# Patient Record
Sex: Female | Born: 1935 | Race: White | Hispanic: No | Marital: Married
Health system: Southern US, Community
[De-identification: ages and names within clinical notes are randomized; demographics above are authoritative.]

---

## 2004-11-19 ENCOUNTER — Ambulatory Visit: Payer: Self-pay | Admitting: Internal Medicine

## 2005-02-07 ENCOUNTER — Other Ambulatory Visit: Payer: Self-pay

## 2005-02-08 ENCOUNTER — Inpatient Hospital Stay: Payer: Self-pay | Admitting: Internal Medicine

## 2006-01-06 ENCOUNTER — Other Ambulatory Visit: Payer: Self-pay

## 2006-01-06 ENCOUNTER — Inpatient Hospital Stay: Payer: Self-pay | Admitting: Internal Medicine

## 2006-05-27 ENCOUNTER — Other Ambulatory Visit: Payer: Self-pay

## 2006-05-27 ENCOUNTER — Emergency Department: Payer: Self-pay | Admitting: Emergency Medicine

## 2006-06-01 ENCOUNTER — Other Ambulatory Visit: Payer: Self-pay

## 2006-06-01 ENCOUNTER — Inpatient Hospital Stay: Payer: Self-pay | Admitting: Internal Medicine

## 2007-10-02 ENCOUNTER — Inpatient Hospital Stay: Payer: Self-pay | Admitting: Internal Medicine

## 2008-05-21 ENCOUNTER — Other Ambulatory Visit: Payer: Self-pay

## 2008-05-21 ENCOUNTER — Inpatient Hospital Stay: Payer: Self-pay | Admitting: Internal Medicine

## 2009-07-25 ENCOUNTER — Inpatient Hospital Stay: Payer: Self-pay | Admitting: Internal Medicine

## 2010-08-15 ENCOUNTER — Emergency Department: Payer: Self-pay | Admitting: Emergency Medicine

## 2010-12-03 ENCOUNTER — Ambulatory Visit: Payer: Self-pay | Admitting: Internal Medicine

## 2011-11-10 ENCOUNTER — Ambulatory Visit: Payer: Self-pay | Admitting: Internal Medicine

## 2011-11-18 ENCOUNTER — Ambulatory Visit: Payer: Self-pay | Admitting: Physical Medicine and Rehabilitation

## 2011-11-18 ENCOUNTER — Inpatient Hospital Stay: Payer: Self-pay | Admitting: Internal Medicine

## 2011-11-18 LAB — URINALYSIS, COMPLETE
Bilirubin,UR: NEGATIVE
Glucose,UR: NEGATIVE mg/dL (ref 0–75)
Ketone: NEGATIVE
Nitrite: NEGATIVE
Ph: 5 (ref 4.5–8.0)
Protein: 100
RBC,UR: 119 /HPF (ref 0–5)
Squamous Epithelial: 8
WBC UR: 271 /HPF (ref 0–5)

## 2011-11-18 LAB — COMPREHENSIVE METABOLIC PANEL
Alkaline Phosphatase: 84 U/L (ref 50–136)
Anion Gap: 13 (ref 7–16)
Bilirubin,Total: 0.5 mg/dL (ref 0.2–1.0)
Calcium, Total: 9 mg/dL (ref 8.5–10.1)
Co2: 25 mmol/L (ref 21–32)
EGFR (Non-African Amer.): 11 — ABNORMAL LOW
Glucose: 118 mg/dL — ABNORMAL HIGH (ref 65–99)
Osmolality: 305 (ref 275–301)
Potassium: 4.3 mmol/L (ref 3.5–5.1)
SGOT(AST): 14 U/L — ABNORMAL LOW (ref 15–37)
SGPT (ALT): 13 U/L
Sodium: 140 mmol/L (ref 136–145)
Total Protein: 7.4 g/dL (ref 6.4–8.2)

## 2011-11-18 LAB — CBC
HCT: 32.7 % — ABNORMAL LOW (ref 35.0–47.0)
HGB: 11.1 g/dL — ABNORMAL LOW (ref 12.0–16.0)
MCH: 29.7 pg (ref 26.0–34.0)
MCV: 88 fL (ref 80–100)
Platelet: 405 10*3/uL (ref 150–440)
RBC: 3.72 10*6/uL — ABNORMAL LOW (ref 3.80–5.20)
WBC: 17.9 10*3/uL — ABNORMAL HIGH (ref 3.6–11.0)

## 2011-11-18 LAB — TROPONIN I: Troponin-I: <0.02 ng/mL

## 2011-11-18 LAB — CK TOTAL AND CKMB (NOT AT ARMC)
CK, Total: 32 U/L (ref 21–215)
CK-MB: 1.1 ng/mL (ref 0.5–3.6)

## 2011-11-19 LAB — COMPREHENSIVE METABOLIC PANEL
Alkaline Phosphatase: 80 U/L (ref 50–136)
Anion Gap: 16 (ref 7–16)
BUN: 78 mg/dL — ABNORMAL HIGH (ref 7–18)
Bilirubin,Total: 0.4 mg/dL (ref 0.2–1.0)
Calcium, Total: 8.6 mg/dL (ref 8.5–10.1)
Chloride: 103 mmol/L (ref 98–107)
Co2: 21 mmol/L (ref 21–32)
Creatinine: 3.87 mg/dL — ABNORMAL HIGH (ref 0.60–1.30)
EGFR (African American): 14 — ABNORMAL LOW
EGFR (Non-African Amer.): 12 — ABNORMAL LOW
SGOT(AST): 14 U/L — ABNORMAL LOW (ref 15–37)
SGPT (ALT): 12 U/L
Sodium: 140 mmol/L (ref 136–145)

## 2011-11-19 LAB — CBC WITH DIFFERENTIAL/PLATELET
Basophil #: 0 10*3/uL (ref 0.0–0.1)
Eosinophil %: 0 %
Lymphocyte #: 0.6 10*3/uL — ABNORMAL LOW (ref 1.0–3.6)
Lymphocyte %: 5.3 %
MCH: 29.1 pg (ref 26.0–34.0)
MCV: 88 fL (ref 80–100)
Monocyte %: 0.1 %
Neutrophil #: 10.5 10*3/uL — ABNORMAL HIGH (ref 1.4–6.5)
RBC: 3.44 10*6/uL — ABNORMAL LOW (ref 3.80–5.20)
RDW: 13.1 % (ref 11.5–14.5)

## 2011-11-20 LAB — CBC WITH DIFFERENTIAL/PLATELET
Basophil #: 0.1 10*3/uL (ref 0.0–0.1)
Basophil %: 0.4 %
Eosinophil #: 0 10*3/uL (ref 0.0–0.7)
HGB: 8.7 g/dL — ABNORMAL LOW (ref 12.0–16.0)
Lymphocyte %: 6.4 %
MCHC: 33.1 g/dL (ref 32.0–36.0)
Monocyte %: 4.8 %
Neutrophil %: 88.4 %
RBC: 2.97 10*6/uL — ABNORMAL LOW (ref 3.80–5.20)
RDW: 13.3 % (ref 11.5–14.5)
WBC: 18.7 10*3/uL — ABNORMAL HIGH (ref 3.6–11.0)

## 2011-11-20 LAB — COMPREHENSIVE METABOLIC PANEL
Albumin: 1.8 g/dL — ABNORMAL LOW (ref 3.4–5.0)
Alkaline Phosphatase: 61 U/L (ref 50–136)
BUN: 82 mg/dL — ABNORMAL HIGH (ref 7–18)
Bilirubin,Total: 0.2 mg/dL (ref 0.2–1.0)
Chloride: 106 mmol/L (ref 98–107)
Co2: 23 mmol/L (ref 21–32)
EGFR (African American): 16 — ABNORMAL LOW
EGFR (Non-African Amer.): 13 — ABNORMAL LOW
Glucose: 106 mg/dL — ABNORMAL HIGH (ref 65–99)
SGOT(AST): 11 U/L — ABNORMAL LOW (ref 15–37)
SGPT (ALT): 8 U/L — ABNORMAL LOW

## 2011-11-21 LAB — CBC WITH DIFFERENTIAL/PLATELET
Basophil #: 0 10*3/uL (ref 0.0–0.1)
Basophil %: 0 %
HCT: 30 % — ABNORMAL LOW (ref 35.0–47.0)
HGB: 9.9 g/dL — ABNORMAL LOW (ref 12.0–16.0)
Lymphocyte #: 0.7 10*3/uL — ABNORMAL LOW (ref 1.0–3.6)
Lymphocyte %: 3.7 %
MCH: 29 pg (ref 26.0–34.0)
MCHC: 32.9 g/dL (ref 32.0–36.0)
Neutrophil #: 16.6 10*3/uL — ABNORMAL HIGH (ref 1.4–6.5)
Platelet: 401 10*3/uL (ref 150–440)
RDW: 13.2 % (ref 11.5–14.5)

## 2011-11-21 LAB — BASIC METABOLIC PANEL
BUN: 74 mg/dL — ABNORMAL HIGH (ref 7–18)
Chloride: 109 mmol/L — ABNORMAL HIGH (ref 98–107)
Creatinine: 3.17 mg/dL — ABNORMAL HIGH (ref 0.60–1.30)
EGFR (African American): 18 — ABNORMAL LOW
EGFR (Non-African Amer.): 15 — ABNORMAL LOW
Glucose: 108 mg/dL — ABNORMAL HIGH (ref 65–99)
Osmolality: 307 (ref 275–301)
Potassium: 4.1 mmol/L (ref 3.5–5.1)
Sodium: 143 mmol/L (ref 136–145)

## 2011-11-22 LAB — CBC WITH DIFFERENTIAL/PLATELET
Basophil #: 0 10*3/uL (ref 0.0–0.1)
Basophil %: 0.1 %
Eosinophil #: 0 10*3/uL (ref 0.0–0.7)
Lymphocyte #: 0.6 10*3/uL — ABNORMAL LOW (ref 1.0–3.6)
Lymphocyte %: 3.8 %
MCH: 29.3 pg (ref 26.0–34.0)
MCHC: 33.2 g/dL (ref 32.0–36.0)
Monocyte #: 0.4 10*3/uL (ref 0.0–0.7)
Monocyte %: 2.6 %
Neutrophil #: 14.7 10*3/uL — ABNORMAL HIGH (ref 1.4–6.5)
Platelet: 355 10*3/uL (ref 150–440)
RDW: 13.3 % (ref 11.5–14.5)

## 2011-11-22 LAB — BASIC METABOLIC PANEL
Calcium, Total: 8.3 mg/dL — ABNORMAL LOW (ref 8.5–10.1)
Co2: 27 mmol/L (ref 21–32)
Creatinine: 2.82 mg/dL — ABNORMAL HIGH (ref 0.60–1.30)
EGFR (Non-African Amer.): 17 — ABNORMAL LOW
Glucose: 137 mg/dL — ABNORMAL HIGH (ref 65–99)
Osmolality: 307 (ref 275–301)
Potassium: 4 mmol/L (ref 3.5–5.1)
Sodium: 143 mmol/L (ref 136–145)

## 2011-11-22 LAB — SEDIMENTATION RATE: Erythrocyte Sed Rate: 62 mm/hr — ABNORMAL HIGH (ref 0–30)

## 2011-11-23 LAB — CBC WITH DIFFERENTIAL/PLATELET
Basophil #: 0 10*3/uL (ref 0.0–0.1)
Basophil %: 0.2 %
Eosinophil %: 3.6 %
HGB: 8.7 g/dL — ABNORMAL LOW (ref 12.0–16.0)
Lymphocyte #: 1.8 10*3/uL (ref 1.0–3.6)
MCV: 88 fL (ref 80–100)
Monocyte %: 5 %
Neutrophil %: 79.6 %
Platelet: 341 10*3/uL (ref 150–440)
RDW: 13.5 % (ref 11.5–14.5)
WBC: 15.4 10*3/uL — ABNORMAL HIGH (ref 3.6–11.0)

## 2011-11-23 LAB — CULTURE, BLOOD (SINGLE)

## 2011-11-23 LAB — BASIC METABOLIC PANEL
BUN: 59 mg/dL — ABNORMAL HIGH (ref 7–18)
Chloride: 110 mmol/L — ABNORMAL HIGH (ref 98–107)
Co2: 26 mmol/L (ref 21–32)
Creatinine: 2.57 mg/dL — ABNORMAL HIGH (ref 0.60–1.30)
Osmolality: 309 (ref 275–301)
Potassium: 4.3 mmol/L (ref 3.5–5.1)
Sodium: 147 mmol/L — ABNORMAL HIGH (ref 136–145)

## 2011-12-15 ENCOUNTER — Inpatient Hospital Stay: Payer: Self-pay | Admitting: Internal Medicine

## 2011-12-15 LAB — CBC WITH DIFFERENTIAL/PLATELET
Basophil %: 0.1 %
Eosinophil #: 0.4 10*3/uL (ref 0.0–0.7)
Eosinophil %: 3.6 %
HCT: 29.1 % — ABNORMAL LOW (ref 35.0–47.0)
HGB: 9.5 g/dL — ABNORMAL LOW (ref 12.0–16.0)
Lymphocyte %: 13 %
MCHC: 32.7 g/dL (ref 32.0–36.0)
Monocyte %: 4.4 %
Neutrophil #: 8.3 10*3/uL — ABNORMAL HIGH (ref 1.4–6.5)
Neutrophil %: 78.9 %
RBC: 3.37 10*6/uL — ABNORMAL LOW (ref 3.80–5.20)

## 2011-12-15 LAB — COMPREHENSIVE METABOLIC PANEL
Albumin: 2.1 g/dL — ABNORMAL LOW (ref 3.4–5.0)
Alkaline Phosphatase: 95 U/L (ref 50–136)
Anion Gap: 15 (ref 7–16)
BUN: 79 mg/dL — ABNORMAL HIGH (ref 7–18)
Bilirubin,Total: 0.4 mg/dL (ref 0.2–1.0)
Calcium, Total: 7.7 mg/dL — ABNORMAL LOW (ref 8.5–10.1)
Creatinine: 6.63 mg/dL — ABNORMAL HIGH (ref 0.60–1.30)
EGFR (Non-African Amer.): 6 — ABNORMAL LOW
Glucose: 80 mg/dL (ref 65–99)
Osmolality: 304 (ref 275–301)
Potassium: 5.2 mmol/L — ABNORMAL HIGH (ref 3.5–5.1)
Sodium: 141 mmol/L (ref 136–145)
Total Protein: 6.6 g/dL (ref 6.4–8.2)

## 2011-12-15 LAB — TSH: Thyroid Stimulating Horm: 2.94 u[IU]/mL

## 2011-12-16 LAB — BASIC METABOLIC PANEL
Calcium, Total: 7.3 mg/dL — ABNORMAL LOW (ref 8.5–10.1)
Chloride: 112 mmol/L — ABNORMAL HIGH (ref 98–107)
Co2: 16 mmol/L — ABNORMAL LOW (ref 21–32)
Creatinine: 6.02 mg/dL — ABNORMAL HIGH (ref 0.60–1.30)
EGFR (African American): 9 — ABNORMAL LOW
Sodium: 144 mmol/L (ref 136–145)

## 2011-12-17 LAB — URINALYSIS, COMPLETE
Bilirubin,UR: NEGATIVE
Cellular Cast: 3
Hyaline Cast: 1
Ketone: NEGATIVE
Nitrite: NEGATIVE
Ph: 5 (ref 4.5–8.0)
Protein: 100
RBC,UR: 63 /HPF (ref 0–5)
Specific Gravity: 1.013 (ref 1.003–1.030)
Squamous Epithelial: 1

## 2011-12-17 LAB — BASIC METABOLIC PANEL
Calcium, Total: 7.4 mg/dL — ABNORMAL LOW (ref 8.5–10.1)
Chloride: 115 mmol/L — ABNORMAL HIGH (ref 98–107)
Co2: 17 mmol/L — ABNORMAL LOW (ref 21–32)
Creatinine: 5.63 mg/dL — ABNORMAL HIGH (ref 0.60–1.30)
Glucose: 98 mg/dL (ref 65–99)

## 2011-12-18 LAB — BASIC METABOLIC PANEL
Calcium, Total: 7.3 mg/dL — ABNORMAL LOW (ref 8.5–10.1)
Chloride: 115 mmol/L — ABNORMAL HIGH (ref 98–107)
Co2: 17 mmol/L — ABNORMAL LOW (ref 21–32)
Creatinine: 5.12 mg/dL — ABNORMAL HIGH (ref 0.60–1.30)
EGFR (African American): 10 — ABNORMAL LOW
Glucose: 82 mg/dL (ref 65–99)
Potassium: 4.6 mmol/L (ref 3.5–5.1)
Sodium: 143 mmol/L (ref 136–145)

## 2011-12-19 LAB — BASIC METABOLIC PANEL
Calcium, Total: 7.1 mg/dL — ABNORMAL LOW (ref 8.5–10.1)
Co2: 15 mmol/L — ABNORMAL LOW (ref 21–32)
EGFR (African American): 11 — ABNORMAL LOW
EGFR (Non-African Amer.): 9 — ABNORMAL LOW
Glucose: 73 mg/dL (ref 65–99)
Potassium: 4.3 mmol/L (ref 3.5–5.1)
Sodium: 145 mmol/L (ref 136–145)

## 2011-12-20 LAB — CBC WITH DIFFERENTIAL/PLATELET
Basophil #: 0 10*3/uL (ref 0.0–0.1)
Basophil %: 0.1 %
Eosinophil #: 0.4 10*3/uL (ref 0.0–0.7)
HCT: 23.5 % — ABNORMAL LOW (ref 35.0–47.0)
Lymphocyte #: 1 10*3/uL (ref 1.0–3.6)
MCH: 28 pg (ref 26.0–34.0)
MCHC: 33 g/dL (ref 32.0–36.0)
MCV: 85 fL (ref 80–100)
Monocyte #: 0.6 10*3/uL (ref 0.0–0.7)
Monocyte %: 3.9 %
Platelet: 276 10*3/uL (ref 150–440)
RBC: 2.77 10*6/uL — ABNORMAL LOW (ref 3.80–5.20)
RDW: 14.2 % (ref 11.5–14.5)
WBC: 14.1 10*3/uL — ABNORMAL HIGH (ref 3.6–11.0)

## 2011-12-20 LAB — BASIC METABOLIC PANEL
Anion Gap: 15 (ref 7–16)
BUN: 61 mg/dL — ABNORMAL HIGH (ref 7–18)
Calcium, Total: 6.8 mg/dL — CL (ref 8.5–10.1)
Creatinine: 4.27 mg/dL — ABNORMAL HIGH (ref 0.60–1.30)
EGFR (African American): 13 — ABNORMAL LOW
EGFR (Non-African Amer.): 10 — ABNORMAL LOW
Glucose: 76 mg/dL (ref 65–99)
Potassium: 4.1 mmol/L (ref 3.5–5.1)
Sodium: 141 mmol/L (ref 136–145)

## 2011-12-21 LAB — BASIC METABOLIC PANEL
Calcium, Total: 7.4 mg/dL — ABNORMAL LOW (ref 8.5–10.1)
Co2: 12 mmol/L — ABNORMAL LOW (ref 21–32)
EGFR (African American): 12 — ABNORMAL LOW
Glucose: 59 mg/dL — ABNORMAL LOW (ref 65–99)
Osmolality: 300 (ref 275–301)
Potassium: 4.1 mmol/L (ref 3.5–5.1)
Sodium: 143 mmol/L (ref 136–145)

## 2011-12-22 LAB — CBC WITH DIFFERENTIAL/PLATELET
Basophil #: 0 10*3/uL (ref 0.0–0.1)
Basophil %: 0.4 %
Eosinophil #: 0.5 10*3/uL (ref 0.0–0.7)
Eosinophil %: 3.3 %
HCT: 20.3 % — ABNORMAL LOW (ref 35.0–47.0)
HGB: 6.7 g/dL — ABNORMAL LOW (ref 12.0–16.0)
Lymphocyte #: 1.3 10*3/uL (ref 1.0–3.6)
MCH: 27.7 pg (ref 26.0–34.0)
MCHC: 32.9 g/dL (ref 32.0–36.0)
MCV: 84 fL (ref 80–100)
Monocyte #: 0.8 10*3/uL — ABNORMAL HIGH (ref 0.0–0.7)
Neutrophil %: 80.9 %
Platelet: 295 10*3/uL (ref 150–440)
WBC: 13.8 10*3/uL — ABNORMAL HIGH (ref 3.6–11.0)

## 2011-12-22 LAB — BASIC METABOLIC PANEL
Calcium, Total: 7.3 mg/dL — ABNORMAL LOW (ref 8.5–10.1)
Creatinine: 4.65 mg/dL — ABNORMAL HIGH (ref 0.60–1.30)
EGFR (Non-African Amer.): 9 — ABNORMAL LOW
Glucose: 78 mg/dL (ref 65–99)
Potassium: 3.9 mmol/L (ref 3.5–5.1)
Sodium: 146 mmol/L — ABNORMAL HIGH (ref 136–145)

## 2011-12-23 LAB — BASIC METABOLIC PANEL
BUN: 56 mg/dL — ABNORMAL HIGH (ref 7–18)
Chloride: 117 mmol/L — ABNORMAL HIGH (ref 98–107)
Co2: 18 mmol/L — ABNORMAL LOW (ref 21–32)
Creatinine: 4.56 mg/dL — ABNORMAL HIGH (ref 0.60–1.30)
EGFR (African American): 12 — ABNORMAL LOW
Glucose: 84 mg/dL (ref 65–99)
Osmolality: 307 (ref 275–301)
Potassium: 3.8 mmol/L (ref 3.5–5.1)
Sodium: 147 mmol/L — ABNORMAL HIGH (ref 136–145)

## 2011-12-23 LAB — HEMOGLOBIN: HGB: 7.8 g/dL — ABNORMAL LOW (ref 12.0–16.0)

## 2012-01-08 ENCOUNTER — Emergency Department: Payer: Self-pay | Admitting: Emergency Medicine

## 2012-02-01 DEATH — deceased

## 2013-09-16 IMAGING — CR DG CHEST 2V
1 series · 2 of 2 positions shown · non-contrast
Comparison: none

REASON FOR EXAM: dyspnea
COMMENTS:

[Series 3: x chest ap · 0.14mm/px · 2 of 2 slices shown]
[im 1/2]
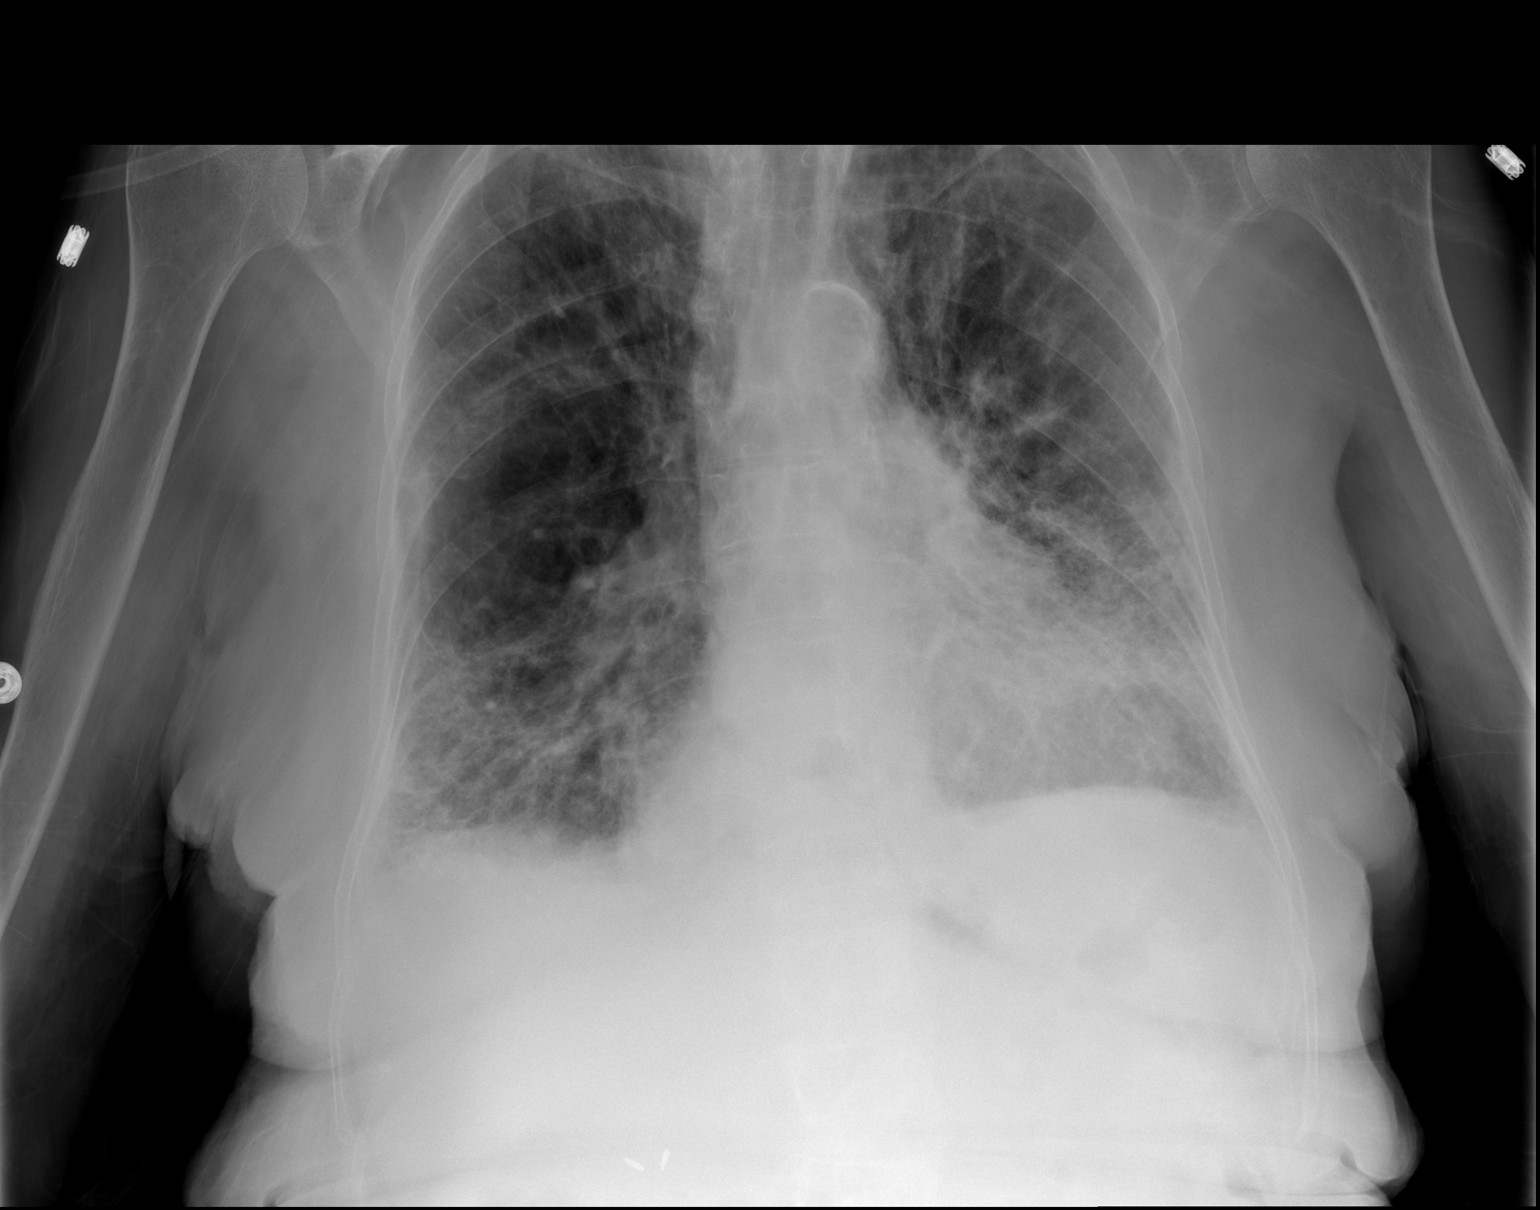
[im 2/2]
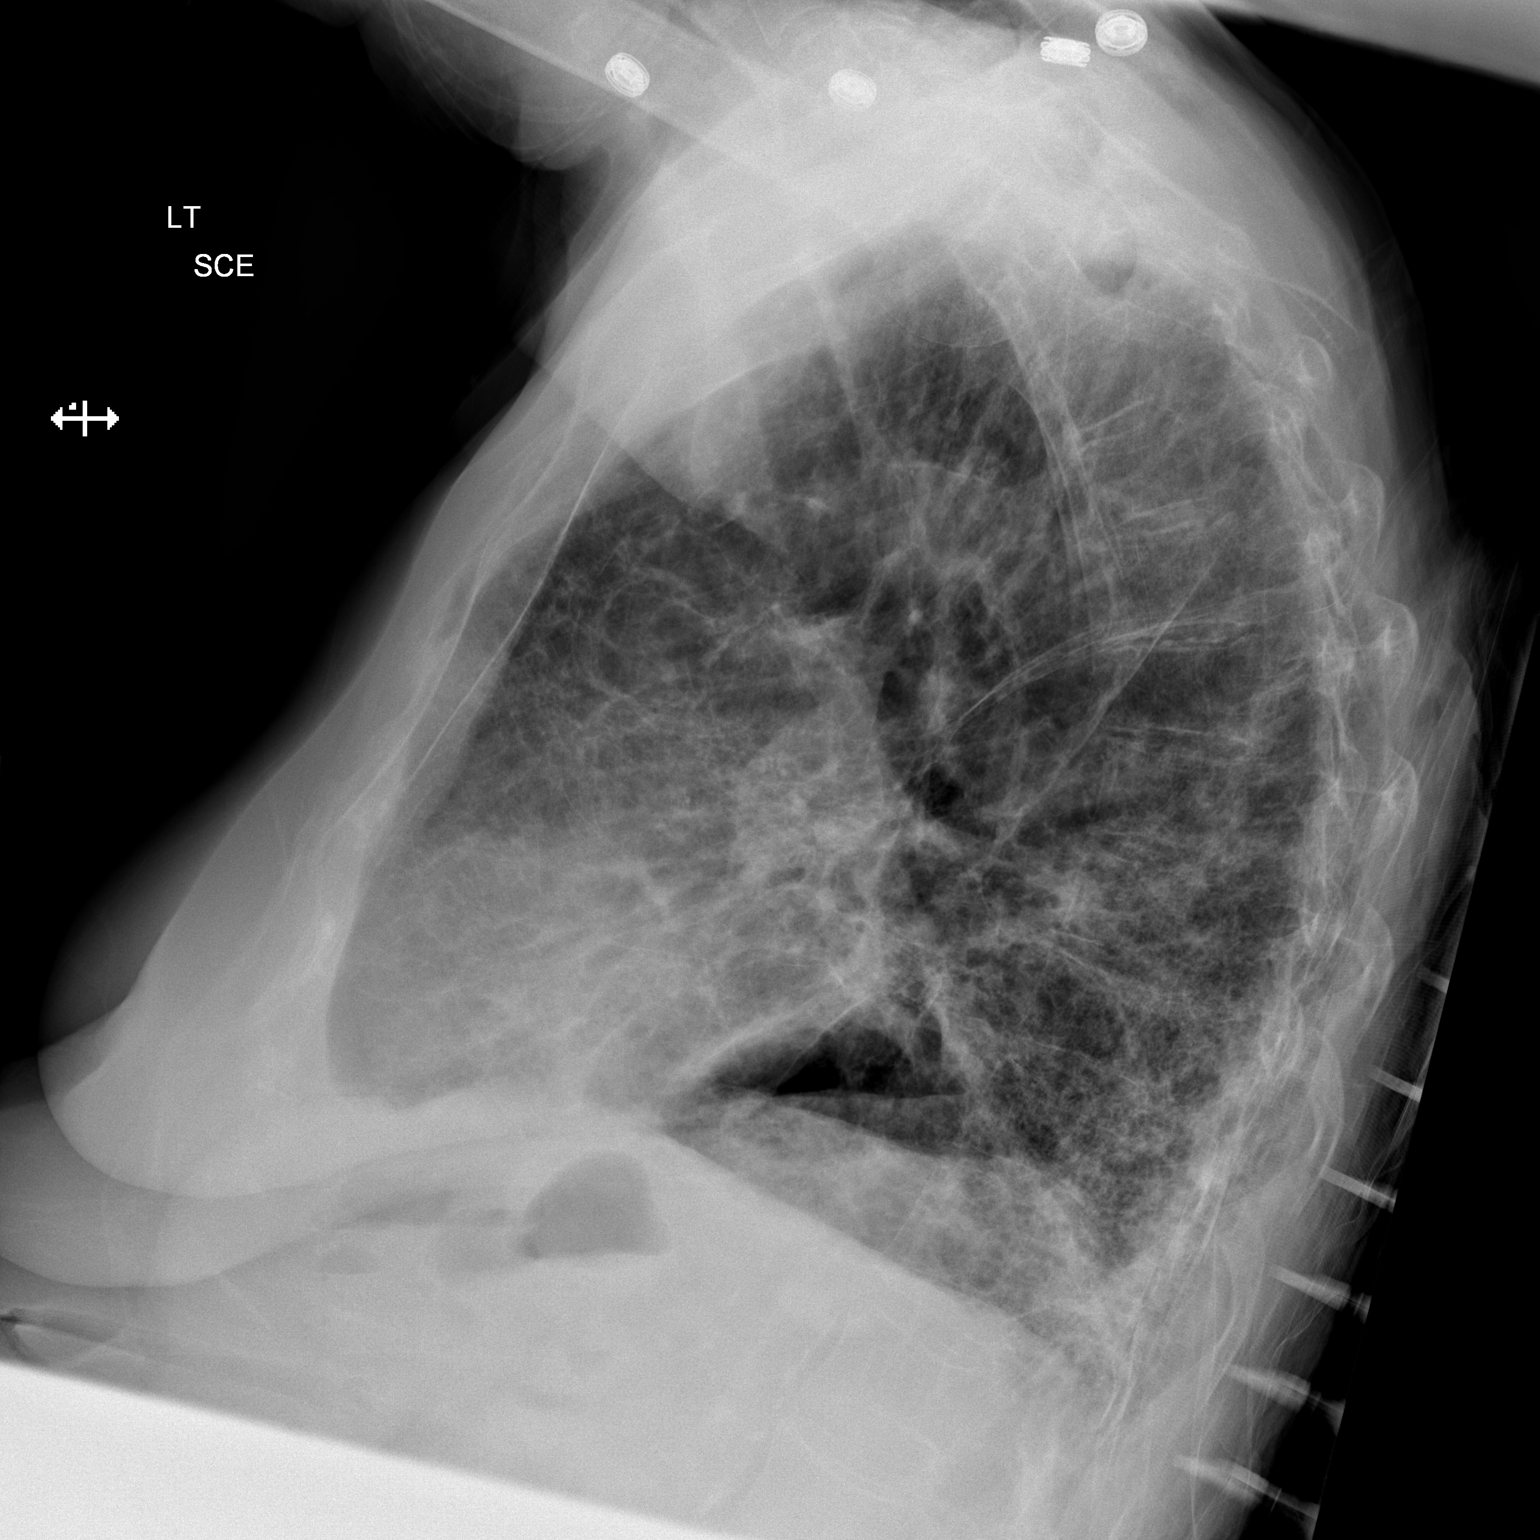

[2 of 2 positions shown; findings below may reference images not displayed]

PROCEDURE:     DXR - DXR CHEST PA (OR AP) AND LATERAL  - November 21, 2011  [DATE]

RESULT:     Comparison is made to the prior exam of the 11/18/2011. The lungs
are bilaterally hyperinflated compatible with COPD. There is diffuse
thickening of the lung markings bilaterally particularly in the lung bases.
The findings are most compatible with pulmonary fibrosis. No pleural
effusion or pulmonary edema is seen. Heart size is normal.
IMPRESSION: 1. There are changes of COPD with pulmonary fibrosis.
2. No definite pneumonia is identified.

## 2013-10-10 IMAGING — CR DG ABDOMEN 1V
1 series · 1 of 1 positions shown · non-contrast
Comparison: none

REASON FOR EXAM: acute renal failure
COMMENTS:

[t abdomen supine]
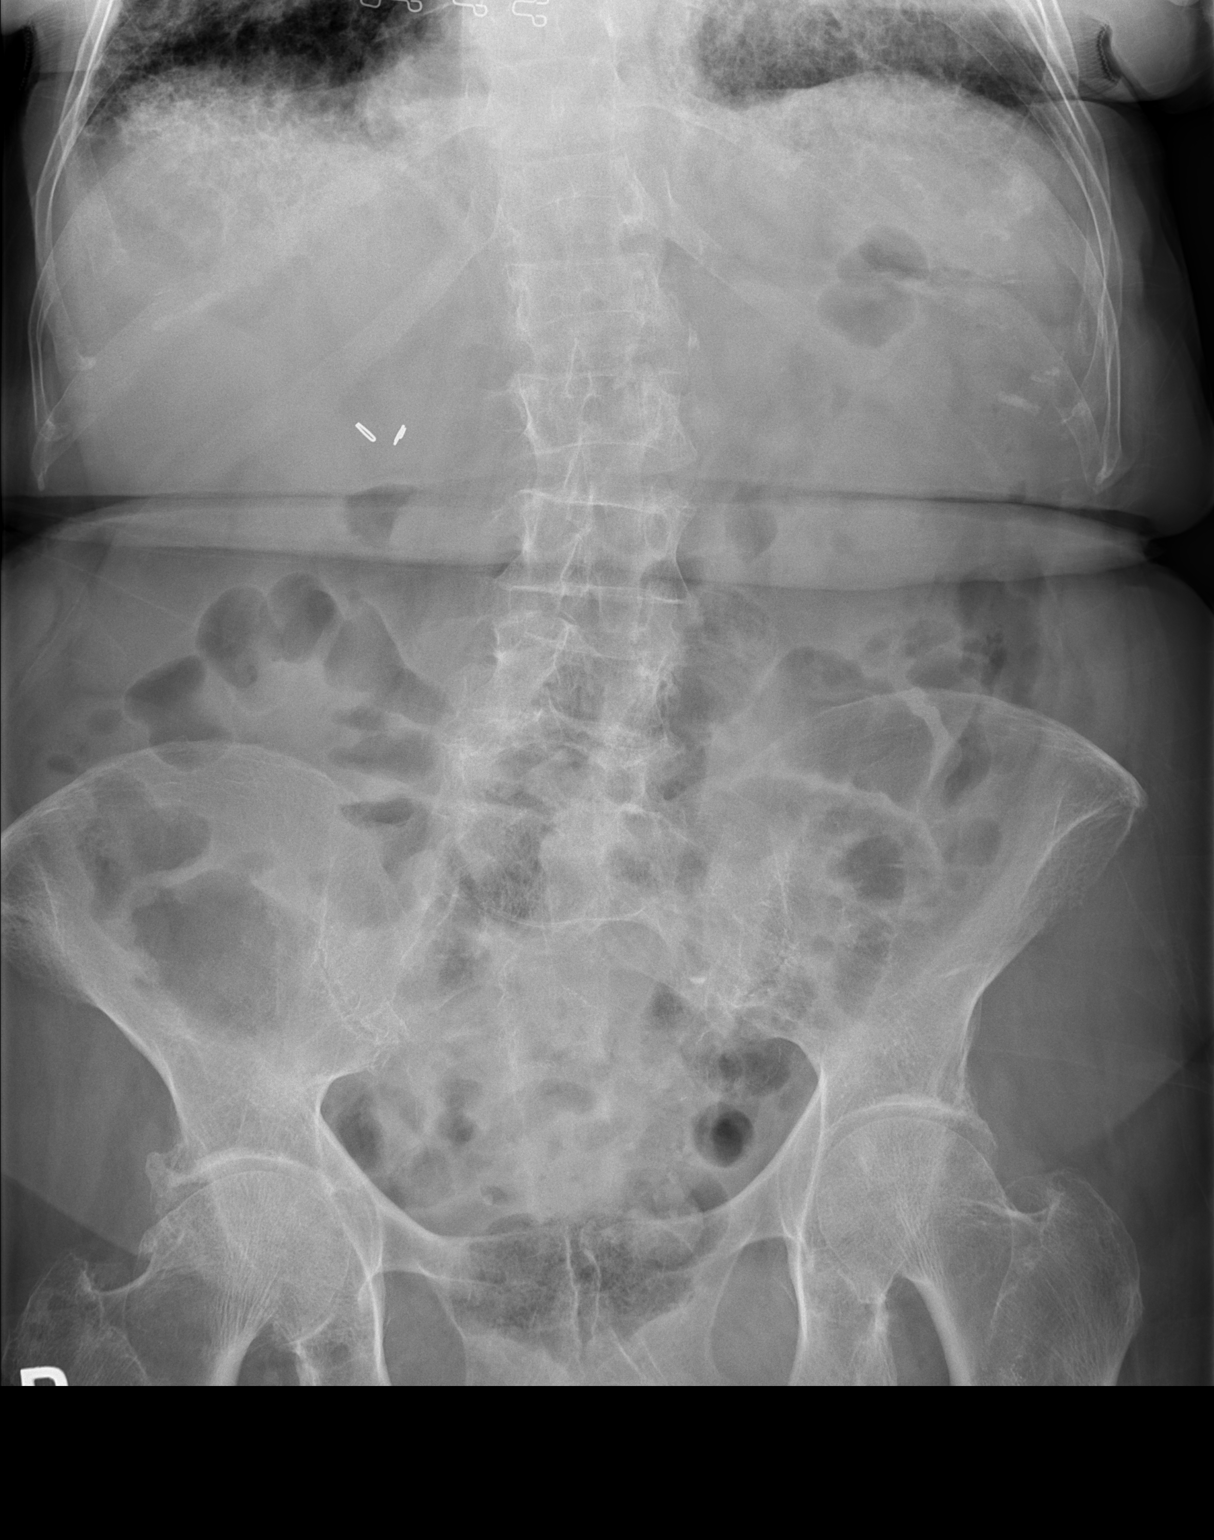

[1 of 1 positions shown; findings below may reference images not displayed]

PROCEDURE:     DXR - DXR KIDNEY URETER BLADDER  - December 15, 2011  [DATE]

RESULT:     An AP view of the abdomen shows a normal bowel gas pattern. No
intra-abdominal mass is seen on routine radiography. No renal or ureteral
calcifications are observed. Postoperative metallic clips are noted in the
region of the gallbladder bed. There is incidentally noted a mild lumbar
scoliosis with a convexity to the left.
IMPRESSION: No acute changes are identified.

## 2013-10-11 IMAGING — US US RENAL KIDNEY
1 series · 17 of 25 positions shown · non-contrast
Comparison: none

REASON FOR EXAM: renal failure
COMMENTS:

[Series 1: us renal kidney · 17 of 109 slices shown]
[im 1/109]
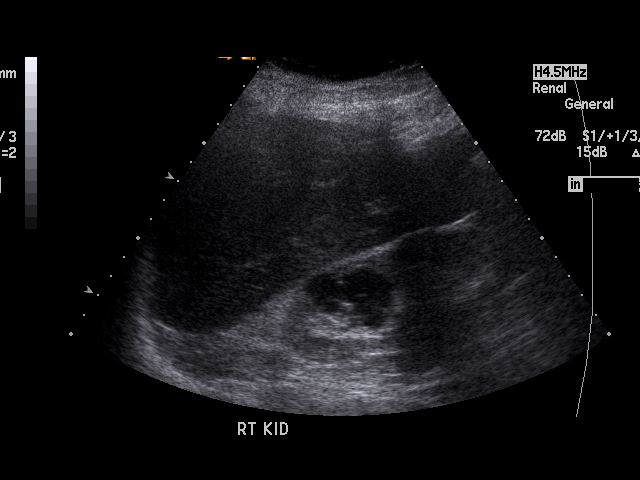
[im 10/109]
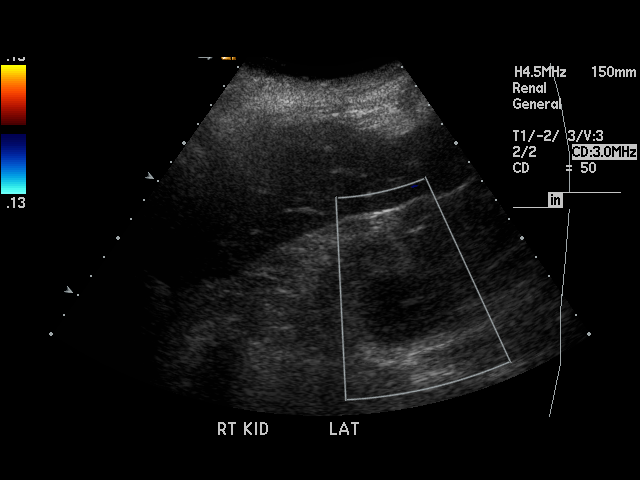
[im 14/109]
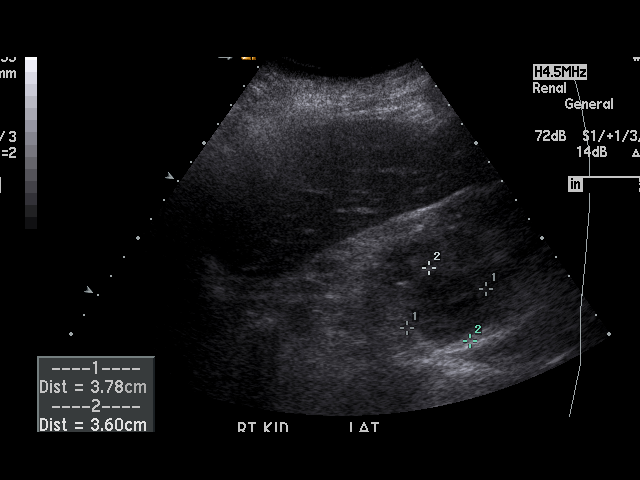
[im 23/109]
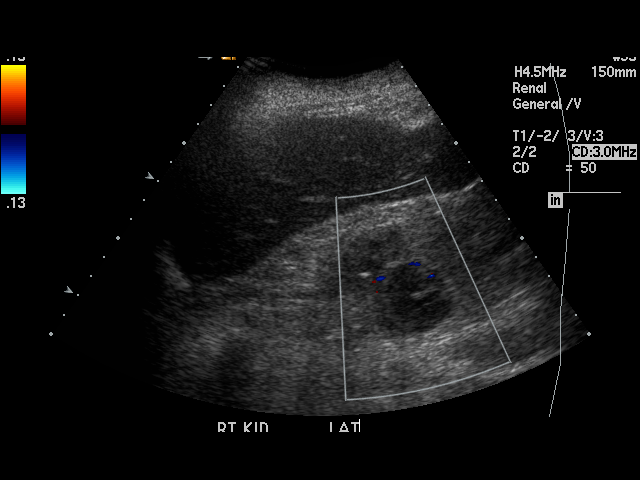
[im 28/109]
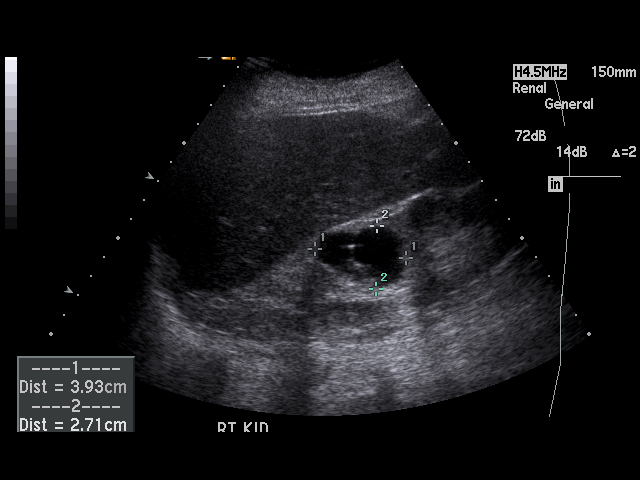
[im 37/109]
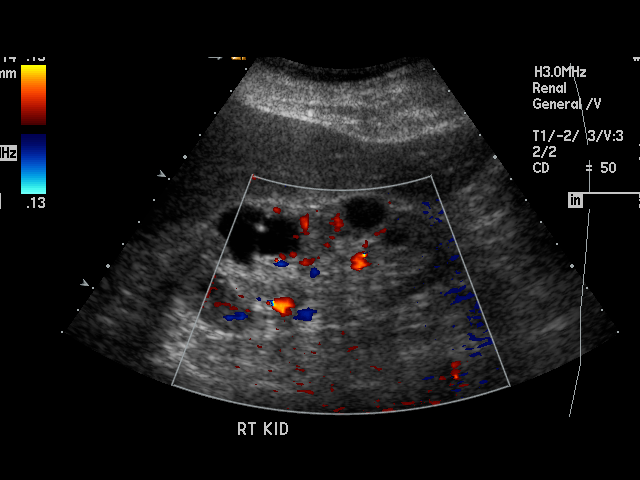
[im 41/109]
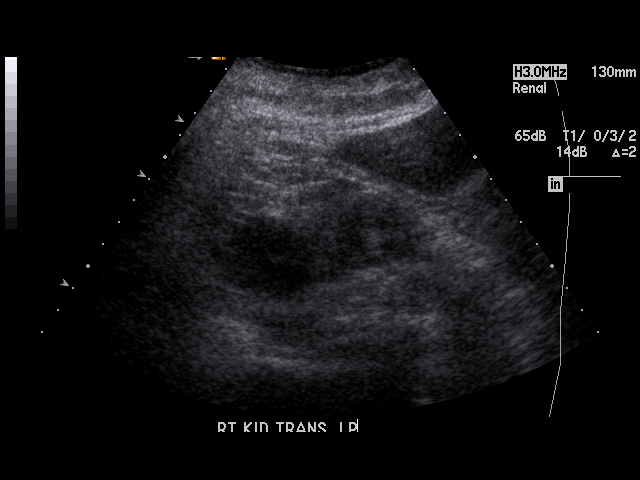
[im 50/109]
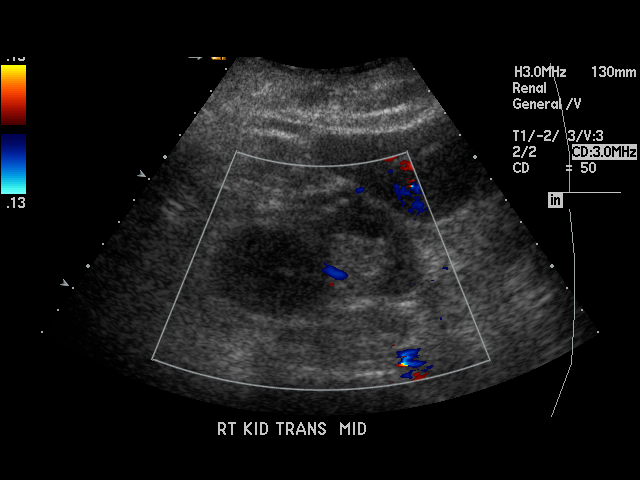
[im 55/109]
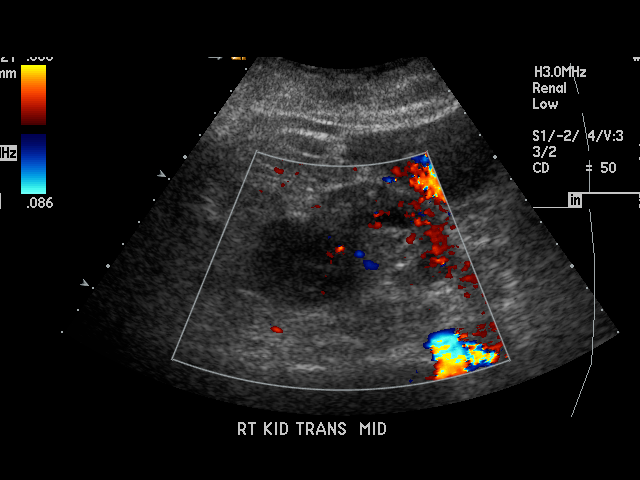
[im 59/109]
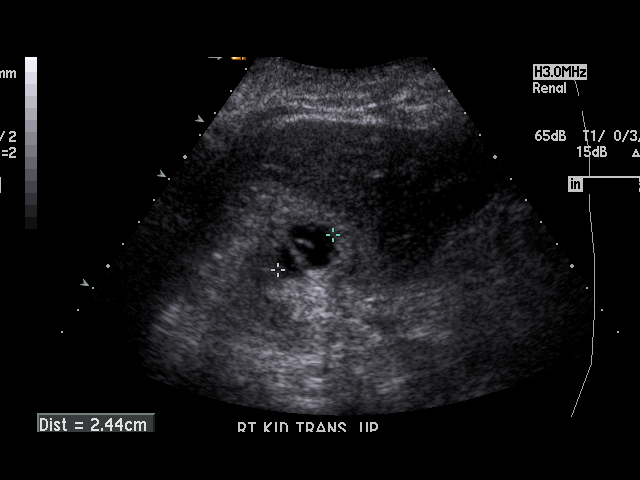
[im 68/109]
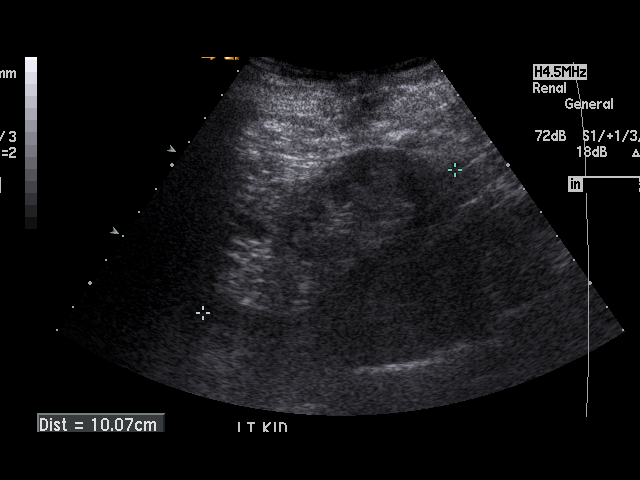
[im 73/109]
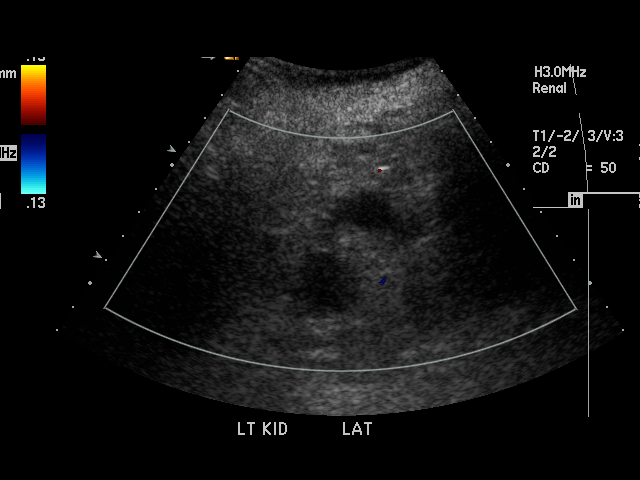
[im 82/109]
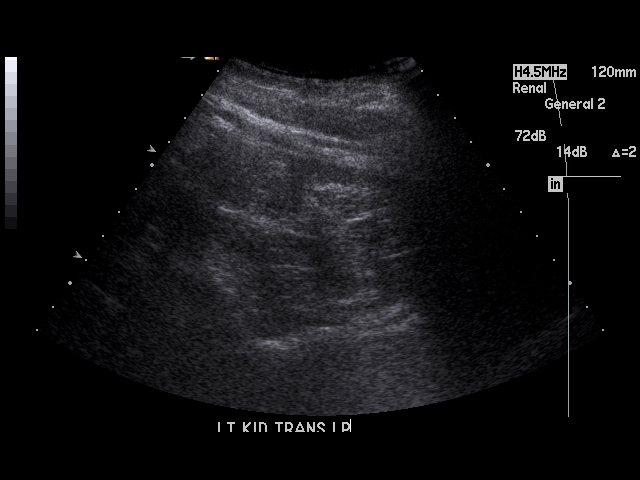
[im 86/109]
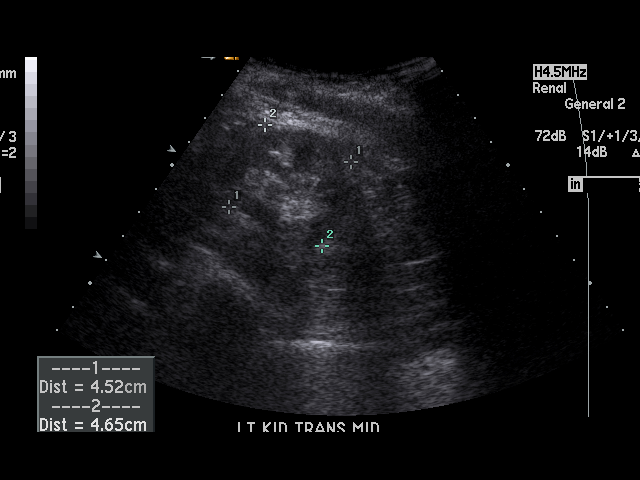
[im 95/109]
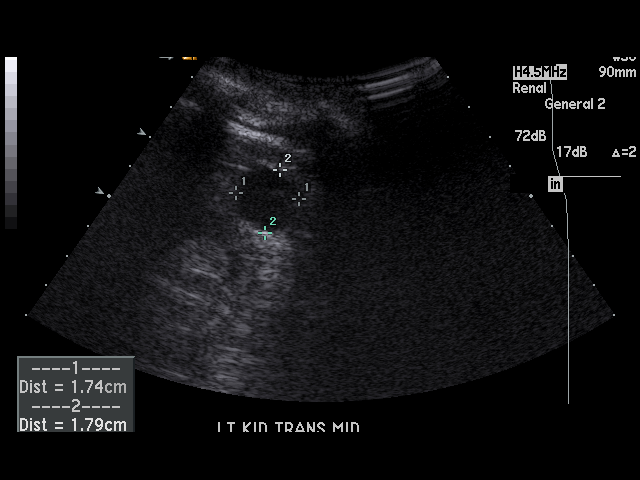
[im 100/109]
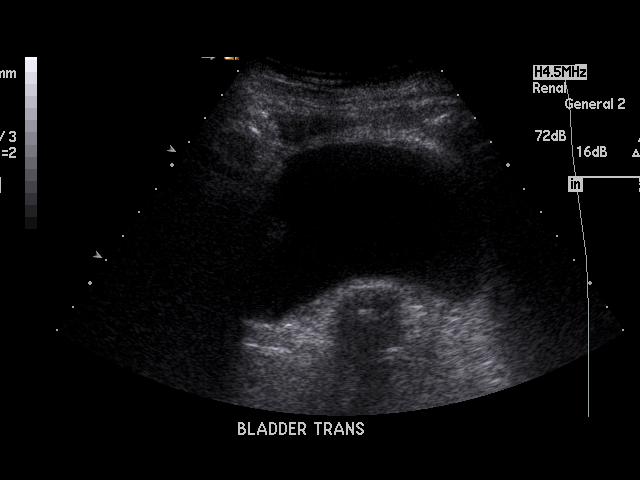
[im 109/109]
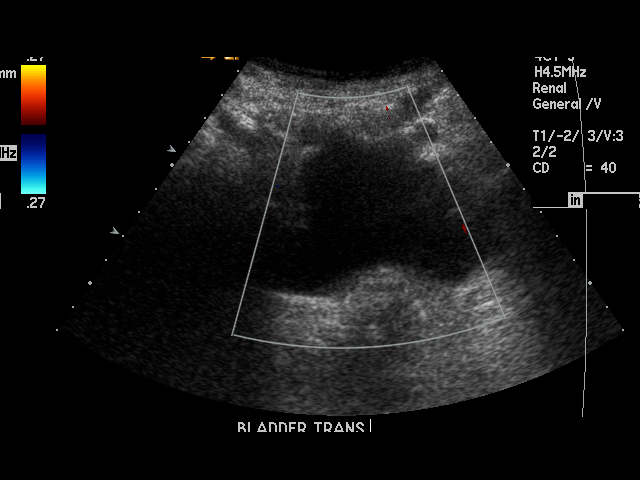

[17 of 25 positions shown; findings below may reference images not displayed]

PROCEDURE:     US  - US KIDNEY  - December 16, 2011  [DATE]

RESULT:     The right kidney measures 10.2 cm x 4.4 cm x 5.0 cm and the left
kidney measures 10.1 cm x 4.5 cm x 4.7 cm. The kidneys bilaterally are
hyperechogenic consistent with a history of renal insufficiency. There are
multiple bilateral cysts. The largest cyst on the right is at the upper
pole. This is a septated cyst that measures 2.44 cm at maximum diameter.
There are a few echoes within the cyst consistent with debris. The largest
cyst on the left measures 2.05 cm at maximum diameter.

On the right, there is a partially exophytic mass that contains echoes and
on Doppler examination shows a small amount of internal flow. The finding is
suspicious for a solid right renal mass. A cyst containing debris and
internal striations could produce similar findings. This could be further
evaluated by MR if clinically indicated. No intrarenal calcifications are
seen. No hydronephrosis is observed on either side. The visualized portion
of the urinary bladder shows no significant abnormalities. The ureteral flow
jets are not demonstrated.
IMPRESSION: 1. No hydronephrosis is seen.
2. No renal calcifications are identified.
3. Bilateral renal cysts are noted.
4. Possible 4.19 cm solid mass involving the midpole of the right kidney.

## 2015-01-25 NOTE — H&P (Signed)
PATIENT NAME:  Amanda Bowen, Amanda Bowen MR#:  [id number] DATE OF BIRTH:  08/29/1922  DATE OF ADMISSION:  11/18/2011  CHIEF COMPLAINT:  Shortness of breath  HISTORY OF PRESENT ILLNESS:  This is an 81 year old female with past medical history of chronic obstructive pulmonary disease, pulmonary fibrosis, hypertension, and gastroesophageal reflux disease, peripheral vascular disease, neuropathy that is presenting to the Emergency Department with the compliant of  shortness of breath for the past couple of days. History is per the patient and per the daughter was is at the bedside. The patient states that over the past week or so after  her medication was changed by her primary care physician Dr Hyacinth Meeker, her diuretic was changed from hydrochlorothiazide to torsemide 10 mg daily. She has been experiencing some shortness of breath. Over the last 2 days it has gotten progressively worse to where she is actually requiring 4 liters of oxygen. Last week she saw her PMD, and she was placed on 2 liters of oxygen continuously at home. Today she also presented to the [facility] for outpatient MRI of the lumbar spine because she had complained of some lower back pain. From there, her daughter decided to bring her to the ED, since she was having continued shortness of breath and looking more tired and weak than normal. In the ED she is on 4 liters saturating at 93%. She denies any recent nausea, vomiting, diarrhea, or recent sick contact. She says that she does have somewhat of a chronic cough and mild sputum production but has not increased in any baseline cough or sputum production. She does state that she has noticed that her urine output has decreased. She has had decreased p.o. intake. She last urinated this morning and has not had the urge to go since hen .The hospitalist services were consulted after the patient was found to have elevated white cell count to be in acute renal failure for inpatient management and treatment.    PRIMARY CARE PHYSICIAN:  Bethann Punches, MD, [facility]  PAST MEDICAL HISTORY:  Hypertension, acid reflux disease, chronic obstructive pulmonary disease on 2 liters of oxygen continuously, pulmonary fibrosis, history of GI bleed, peripheral vascular disease and neuropathy.   PAST SURGICAL HISTORY: Sigmoid resection diverticular disease, hysterectomy, appendectomy, cholecystectomy, ventral hernia, hemorrhoid banding x2, left lower lobe arterial bypass, cataract surgery.   ALLERGIES: Aspirin, codeine, penicillin, and Pepcid.  HOME MEDICATIONS:  Azor 5/20 one tab daily, calcium and vitamin D 600/400 1 tab daily,. 2 liters of oxygen at home. Iron 1 tab daily, omeprazole 20 mg daily, furosemide 10 mg  a day, tramadol 50 mg 4 times a day..   SOCIAL HISTORY: She quit smoking about 17 years ago with a 50-pack-year history. No illicit drugs. No drinking. She used to work at <<MISSING TEXT>>. She is a widower with 6 children. One passed away shortly after birth. The other 5 are healthy. She is originally from  [place], lives by herself.  She does not use any assistive devices for walking.   FAMILY HISTORY: Brother had brain tumor, another brother died of cerebrovascular accident, a sister died of a brain tumor, mother died of stomach cancer. Father and brother died of MIs.   REVIEW OF SYSTEMS:    CONSTITUTIONAL: She has fatigue, or weakness. Denies any fever.   EYES: No blurry vision, double vision, <<MISSING TEXT>>, glaucoma.. She does wear glasses.   ENT: <<MISSING TEXT>> discharge. She has hearing loss and uses hearing aids.  CARDIOVASCULAR: She does not have  any chest pain that she endorses or <<MISSING TEXT>>. No orthopnea, no edema, no arrhythmias or palpitations. No syncope. She does have dyspnea on exertion.   RESPIRATORY: Dyspnea on exertion increased O2 requirement. No painful respirations. She has a history of chronic obstructive pulmonary disease, pulmonary fibrosis, on 2  liters of oxygen as of one week ago. She does not endorse any increased coughing or wheezing.   GASTROINTESTINAL: No nausea, vomiting, or diarrhea. No abdominal pain, hematemesis, melena, or bright red blood per rectum.   GU: No dysuria, hematuria, or renal calculi. Urinary frequency has decreased and positive urinary incontinence.   GYN:  No vaginal discharge, no <<MISSING TEXT>>.  ENDOCRINE: No polyuria or nocturia or thyroid problems, increased sweating, heat or cold intolerance.  HEME/LYMPH:  No anemia or easy bruising, or swollen glands.  INTEGUMENTARY:  No active rash, lesions, change in <<MISSING TEXT>> skin.  MUSCULOSKELETAL: No pain in the neck, back, knee, and the hip. No arthritis. She does endorse chronic shin pain to touch.   NEUROLOGIC: No numbness, or weakness, <<MISSING TEXT>>, epilepsy, tremor,  vertigo, ataxia.  PSYCH: No anxiety, insomnia, ADD, OCD, bipolar, depression. She does endorse claustrophobia.   PHYSICAL EXAMINATION:  VITAL SIGNS:  ED vital signs are as follows:  Temperature 97.2, pulse 87, respirations at 22,  94% pulse oximetry on 4 liters of oxygen.   GENERAL: She is cachectic thin, elderly female.   HEENT: Pupils are equal, reactive to light. Extraocular movements intact. Sclerae clear. Oral mucous membranes are dry.   NECK: No JVD or thyromegaly. No lymphadenopathy. No carotid bruits.   CARDIOVASCULAR: Regular rate and rhythm. S1, S2. No murmurs, rubs or gallops, or rubs appreciated. No lower extremity edema, 2+ dorsalis pedis.   BREASTS:  No obvious mass.   ABDOMEN: Soft, nontender, nondistended.  RESPIRATORY:  Good airway entry. No diffuse wheezing, decreased breath sounds bilaterally. She does have dry crackles noted throughout the bilateral lower bases up to the mid base level with some diffuse coarse breath sounds noted throughout. No overt wet crackles suggestive of pulmonary edema, congestive heart failure or pleural effusion.   GU:  Deferred at this time.   MUSCULOSKELETAL: Strength five out of five bilateral upper and lower extremities. No clubbing, cyanosis, or edema.   SKIN: No rash, lesions, erythema or nodules. Skin is warm on the lower shins,  tender to touch which is chronic.   LYMPH:  No cervical adenopathy.   NEUROLOGIC: Cranial nerves 2 through 12 intact.  PSYCH: Alert and oriented x3   PERTINENT LABORATORY DATA: Sodium of 140, potassium 4.3, chloride is 102,  BUN  81, creatinine is 3.93, this is up from 3 years ago and it was 1.82.Marland Kitchen Serum albumin is 2.3, a white cell count 17.9, hemoglobin 11.1, hematocrit 32.7, platelet count 405,000.Marland Kitchen She did have urinalysis done that shows a pH 5.0, specific gravity of 1.010. She has 100 mg of  protein, 119 red blood cells, 271 white blood cells, 2+ bacteria, 3+ leukocyte esterase, nitrite negative. WBC <<MISSING TEXT>>.   She did have a chest x-ray done that showed findings consistent with chronic obstructive pulmonary disease and pulmonary fibrosis, cannot exclude acute bronchitis in appropriate clinical setting due to any objective evidence of any congestive heart failure but interstitial edema could be obscured by underlying abnormal pulmonary parenchymal pattern.   She also had an MRI of her lumbar spine as an outpatient done today that showed diffuse disk bulge causing moderately severe bilateral L4-L5 foraminal narrowing. Clinical correlation otherwise  significant degenerative disk disease narrowing at L5-S1. The study is otherwise unremarkable, aside for what appears to be bilateral renal cyst.  EKG shows old right bundle branch block, otherwise normal sinus rhythm, rate of about 90 beats per minute.  ASSESSMENT AND PLAN: This is an 81 year old female with past medical history of chronic obstructive pulmonary disease, pulmonary fibrosis who presented with her shortness of breath, low back pain, poor p.o. intake and increased O2 requirement. 1. Urinary tract infection.  Urinalysis positive for white blood cells,  leukocyte esterase, positive bacteria. She did get 1 dose  of Levaquin in the ED, we will continue Levaquin 500 milligrams q.24h. Resume p.o. in a.m. she does seem to be in acute renal failure secondary to probable urinary tract infection and poor  p.o. intake. Will continue her on IV fluids, normal saline 50 mL  an hour. Her urinary tract infection could also be causing her low back pain and causing increase O2 requirement.  2. Shortness of breath related to her urinary tract infection probably causing an increase O2 requirement secondary to some early sepsis as she may be having dehydration acute renal failure, leukocytosis treated. 3. Acute renal failure secondary to UTIs management as stated above. Will hold diuretics for now. Continue gentle IV fluids hold ACE, ARBs, and NSAIDs. 4. Lower back pain most likely multifactorial secondary to urinary tract infection, L4-S1 disk pathology. See official MRI. Will continue antibiotics and tramadol  for pain management supportive care also. 5. Chronic obstructive pulmonary disease, chronic, pulmonary fibrosis currently with O2 requirement. We will continue with supplemental O2 at 4 liters for now, increase as needed. Otherwise I do not believe this is a chronic obstructive pulmonary disease exacerbation given the clinical history. She did get the dose of steroids in the ED, we will continue with IV Levaquin for now. No diffuse wheezing, heard. We will hold off on steroids at this time. Will continue with supplemental we will give  p.r.n. bronchodilators.  6. Hypertension. Hold any ACE, ARBs.  Will continue <<MISSING TEXT>>.  7. Gastroesophageal reflux disease, deep vein thrombosis prophylaxis. We will continue with PPI and renal dosing of heparin.  Time spent dictating and evaluating the patient 55 minutes.  ____________________________ Ellamae SiaVipul S. Sherryll BurgerShah, MD vss:ljs D: 11/18/2011 18:44:29 ET T: 11/19/2011 07:12:14  ET JOB#: [id number]  cc: Dajana Gehrig S. Sherryll BurgerShah, MD, <Dictator> Danella PentonMark F. Miller, MD

## 2015-01-25 NOTE — Discharge Summary (Signed)
PATIENT NAME:  Amanda Bowen, Amanda Bowen MR#:  [id number] DATE OF BIRTH:  22-Oct-1921  DATE OF ADMISSION:  11/18/2011 DATE OF DISCHARGE:  11/23/2011   DISCHARGE DIAGNOSES:  1. SIRS due to acute cystitis.  2. Acute renal failure, prerenal.  3. Severe pulmonary fibrosis with cor pulmonale.  4. Peripheral vascular disease.  5. History of gastrointestinal bleed.  6. Reflux esophagitis.   DISCHARGE MEDICATIONS:  1. Tylenol 650 mg q.4 p.r.n.  2. 2 liters O2 nasal cannula.  3. Omeprazole 20 mg daily.  4. Xanax 0.25 mg daily p.r.n. anxiety.  5. Levaquin 250 mg daily x7 days.  6. Albuterol 2 puffs q.4 p.r.n.  7. Symbicort 160/4.5 two puffs b.i.d.  8. Milk of Magnesia 30 mL daily p.r.n.  9. DuoNeb 3 mL SVN t.i.d.  10. Calcium 600 mg daily.   REASON FOR ADMISSION: The patient is an 81 year old female who presented with acute renal failure, tachycardia, fever, and leukocytosis. Please see history and physical for history of present illness, past medical history, and physical exam.   HOSPITAL COURSE: The patient was admitted, placed on IV Rocephin and Levaquin. Her white count improved down to 15,000 on discharge. Her tachycardia improved. Her urine culture, unfortunately, did not grow much but urinalysis was significantly abnormal. She continued on oxygen for her pulmonary fibrosis. She had episodes of severe desaturation with dyspnea on exertion but it was thought due to her chronic and progressive pulmonary fibrosis. There was no evidence really of pneumonia or of congestive heart failure. EF is 60% by echocardiography. She has known cor pulmonale related to pulmonary fibrosis and will be using TED hose and not any diuretics. Her creatinine did improve from 3.8 down to 2.5 on discharge with some mild hydration. Overall prognosis is poor with her pulmonary fibrosis. She is very deconditioned and will be getting physical therapy.  ____________________________ Danella PentonMark F. Miller, MD mfm:drc D: 11/23/2011 08:03:32  ET T: 11/23/2011 08:14:54 ET JOB#: [id number]  cc: Danella PentonMark F. Miller, MD, <Dictator> MARK Sherlene ShamsF MILLER MD ELECTRONICALLY SIGNED 11/24/2011 7:48

## 2015-01-25 NOTE — Op Note (Signed)
PATIENT NAME:  Amanda Bowen, Anahit E MR#:  [id number] DATE OF BIRTH:  [birth year]  DATE OF PROCEDURE:  12/22/2011  PREOPERATIVE DIAGNOSES:  1. Dehydration. 2. Acute renal failure.   POSTOPERATIVE DIAGNOSES: 1. Dehydration. 2. Acute renal failure.   OPERATION: Right internal jugular line placement under ultrasound guidance.   SURGEON: Quentin Orealph L. Ely, III, MD    ANESTHESIA: Local.   OPERATIVE PROCEDURE: With the patient in the supine position and after local anesthetic in proper positioning, the area was prepped with ChloraPrep and draped with sterile towels. Using ultrasound to interrogate the neck, the right internal jugular vein was identified and a needle passed into the vein under direct vision. A single pass was utilized. Wire was then passed into the right heart without difficulty. The skin was nicked and the dilator passed over the wire. The dilator was removed and the triple lumen catheter inserted over the wire and the great vessel system. It was secured in place with 3-0 silk and flushed. Sterile dressing was applied. Chest x-ray is pending.   The procedure was quite difficult with the patient's inability to cooperate and lay still, constantly turning her head and moving.  ____________________________ Carmie Endalph L. Ely III, MD rle:drc D: 12/22/2011 15:47:26 ET T: 12/23/2011 07:47:45 ET JOB#: [id number]  cc: Quentin Orealph L. Ely III, MD, <Dictator> Quentin OreALPH L ELY MD ELECTRONICALLY SIGNED 12/27/2011 12:20

## 2015-01-25 NOTE — H&P (Signed)
PATIENT NAME:  Amanda Bowen, Amanda Bowen MR#:  [id number] DATE OF BIRTH:  1922-08-05  DATE OF ADMISSION:  11/18/2011  CHIEF COMPLAINT:  Shortness of breath  HISTORY OF PRESENT ILLNESS:  This is an 81 year old female with past medical history of chronic obstructive pulmonary disease, pulmonary fibrosis, hypertension, and gastroesophageal reflux disease, peripheral vascular disease, neuropathy that is presenting to the Emergency Department with the compliant of  shortness of breath for the past couple of days. History is per the patient and per the daughter was is at the bedside. The patient states that over the past week or so after  her medication was changed by her primary care physician Dr Hyacinth Meeker, her diuretic was changed from hydrochlorothiazide to torsemide 10 mg daily. She has been experiencing some shortness of breath. Over the last 2 days it has gotten progressively worse to where she is actually requiring 4 liters of oxygen. Last week she saw her PMD, and she was placed on 2 liters of oxygen continuously at home. Today she also presented to the [facility] for outpatient MRI of the lumbar spine because she had complained of some lower back pain. From there, her daughter decided to bring her to the ED, since she was having continued shortness of breath and looking more tired and weak than normal. In the ED she is on 4 liters saturating at 93%. She denies any recent nausea, vomiting, diarrhea, or recent sick contact. She says that she does have somewhat of a chronic cough and mild sputum production but has not increased in any baseline cough or sputum production. She does state that she has noticed that her urine output has decreased. She has had decreased p.o. intake. She last urinated this morning and has not had the urge to go since hen .The hospitalist services were consulted after the patient was found to have elevated white cell count to be in acute renal failure for inpatient management and treatment.    PRIMARY CARE PHYSICIAN:  Bethann Punches, MD, [facility]  PAST MEDICAL HISTORY:  Hypertension, acid reflux disease, chronic obstructive pulmonary disease on 2 liters of oxygen continuously, pulmonary fibrosis, history of GI bleed, peripheral vascular disease and neuropathy.   PAST SURGICAL HISTORY: Sigmoid resection diverticular disease, hysterectomy, appendectomy, cholecystectomy, ventral hernia, hemorrhoid banding x2, left lower lobe arterial bypass, cataract surgery.   ALLERGIES: Aspirin, codeine, penicillin, and Pepcid.  HOME MEDICATIONS:  Azor 5/20 one tab daily, calcium and vitamin D 600/400 1 tab daily,. 2 liters of oxygen at home. Iron 1 tab daily, omeprazole 20 mg daily, furosemide 10 mg  a day, tramadol 50 mg 4 times a day.  SOCIAL HISTORY: She quit smoking about 17 years ago with a 50-pack-year history. No illicit drugs. No drinking. She used to work as a Engineer, civil (consulting). She is a widower with 6 children. One passed away shortly after birth. The other 5 are healthy. She is originally from  [place], lives by herself.  She does not use any assistive devices for walking.   FAMILY HISTORY: Brother had brain tumor, another brother died of cerebrovascular accident, a sister died of a brain tumor, mother died of stomach cancer. Father and brother died of MIs.   REVIEW OF SYSTEMS:  CONSTITUTIONAL: She has fatigue, or weakness. Denies any fever. EYES: No blurry vision, double vision, pain, redness, inflammation or glaucoma.. She does wear glasses. ENT: No tinnitus, ear pain, seasonal allergies, epistaxis or discharge. She has hearing loss and uses hearing aids. CARDIOVASCULAR: She does not have  any chest pain that she endorses with deep breathing at this time. No orthopnea, no edema, no arrhythmias or palpitations. No syncope. She does have dyspnea on exertion. RESPIRATORY: Dyspnea on exertion increased O2 requirement. No painful respirations. She has a history of chronic obstructive pulmonary  disease, pulmonary fibrosis, on 2 liters of oxygen as of one week ago. She does not endorse any increased coughing or wheezing. GASTROINTESTINAL: No nausea, vomiting, or diarrhea. No abdominal pain, hematemesis, melena, or bright red blood per rectum. GU: No dysuria, hematuria, or renal calculi. Urinary frequency has decreased and positive urinary incontinence. GYN:  No vaginal discharge, no breast mass. ENDOCRINE: No polyuria or nocturia or thyroid problems, increased sweating, heat or cold intolerance. HEME/LYMPH:  No anemia or easy bruising, or swollen glands. INTEGUMENTARY:  No active rash, lesions, change in mole, hair, or skin. MUSCULOSKELETAL: No pain in the neck, back, knee, or hip. No arthritis. She does endorse chronic shin pain to touch. NEUROLOGIC: No numbness, or weakness, dysarthria, epilepsy, tremor,  vertigo, ataxia. PSYCH: No anxiety, insomnia, ADD, OCD, bipolar, depression. She does endorse claustrophobia.   PHYSICAL EXAMINATION:  VITAL SIGNS:  ED vital signs are as follows:  Temperature 97.2, pulse 87, respirations at 22,  94% pulse oximetry on 4 liters of oxygen.   GENERAL: She is cachectic thin, elderly female.   HEENT: Pupils are equal, reactive to light. Extraocular movements intact. Sclerae clear. Oral mucous membranes are dry.   NECK: No JVD or thyromegaly. No lymphadenopathy. No carotid bruits.   CARDIOVASCULAR: Regular rate and rhythm. S1, S2. No murmurs, rubs or gallops, or rubs appreciated. No lower extremity edema, 2+ dorsalis pedis.   BREASTS:  No obvious mass.   ABDOMEN: Soft, nontender, nondistended.  RESPIRATORY:  Good airway entry. No diffuse wheezing, decreased breath sounds bilaterally. She does have dry crackles noted throughout the bilateral lower bases up to the mid base level with some diffuse coarse breath sounds noted throughout. No overt wet crackles suggestive of pulmonary edema, congestive heart failure or pleural effusion.   GU: Deferred at this time.    MUSCULOSKELETAL: Strength five out of five bilateral upper and lower extremities. No clubbing, cyanosis, or edema.   SKIN: No rash, lesions, erythema or nodules. Skin is warm on the lower shins,  tender to touch which is chronic.   LYMPH:  No cervical adenopathy.   NEUROLOGIC: Cranial nerves 2 through 12 intact.  PSYCH: Alert and oriented x3   PERTINENT LABORATORY DATA: Sodium of 140, potassium 4.3, chloride is 102,  BUN  81, creatinine is 3.93, this is up from 3 years ago and it was 1.82.Marland Kitchen. Serum albumin is 2.3, a white cell count 17.9, hemoglobin 11.1, hematocrit 32.7, platelet count 405,000. She did have urinalysis done that shows a pH 5.0, specific gravity of 1.010. She has 100 mg of  protein, 119 red blood cells, 271 white blood cells, 2+ bacteria, 3+ leukocyte esterase, nitrite negative. WBC clumping present.   She did have a chest x-ray done that showed findings consistent with chronic obstructive pulmonary disease and pulmonary fibrosis, cannot exclude acute bronchitis in appropriate clinical setting due to any objective evidence of any congestive heart failure but interstitial edema could be obscured by underlying abnormal pulmonary parenchymal pattern.   She also had an MRI of her lumbar spine as an outpatient done today that showed diffuse disk bulge causing moderately severe bilateral L4-L5 foraminal narrowing. Clinical correlation otherwise significant degenerative disk disease narrowing at L5-S1. The study is otherwise unremarkable, aside  for what appears to be bilateral renal cyst.  EKG shows old right bundle branch block, otherwise normal sinus rhythm, rate of about 90 beats per minute.  ASSESSMENT AND PLAN: This is an 81 year old female with past medical history of chronic obstructive pulmonary disease, pulmonary fibrosis who presented with her shortness of breath, low back pain, poor p.o. intake and increased O2 requirement. 1. Urinary tract infection. Urinalysis positive for  white blood cells,  leukocyte esterase, positive bacteria. She did get 1 dose  of Levaquin in the ED, we will continue Levaquin 500 milligrams q.24h. Resume p.o. in a.m. she does seem to be in acute renal failure secondary to probable urinary tract infection and poor  p.o. intake. Will continue her on IV fluids, normal saline 50 mL  an hour. Her urinary tract infection could also be causing her low back pain and causing increase O2 requirement.  2. Shortness of breath related to her urinary tract infection probably causing an increase O2 requirement secondary to some early sepsis as she may be having dehydration acute renal failure, leukocytosis treated. 3. Acute renal failure secondary to UTIs management as stated above. Will hold diuretics for now. Continue gentle IV fluids hold ACE, ARBs, and NSAIDs. 4. Lower back pain most likely multifactorial secondary to urinary tract infection, L4-S1 disk pathology. See official MRI. Will continue antibiotics and tramadol  for pain management supportive care also. 5. Chronic obstructive pulmonary disease, chronic, pulmonary fibrosis currently with O2 requirement. We will continue with supplemental O2 at 4 liters for now, increase as needed. Otherwise I do not believe this is a chronic obstructive pulmonary disease exacerbation given the clinical history. She did get the dose of steroids in the ED, we will continue with IV Levaquin for now. No diffuse wheezing, heard. We will hold off on steroids at this time. Will continue with supplemental we will give  p.r.n. bronchodilators.  6. Hypertension. Hold any ACE, ARBs.  Will continue amlodipine.  7. Gastroesophageal reflux disease, deep vein thrombosis prophylaxis. We will continue with PPI and renal dosing of heparin.  TIME SPENT: Time spent dictating and evaluating the patient was 55 minutes.  ____________________________ Stephanie Acre, MD vm:ljs D: 11/18/2011 18:44:00 ET T: 11/19/2011 07:12:14  ET JOB#: [id number]  cc: Danella Penton, MD Stephanie Acre, MD, <Dictator> Stephanie Acre MD ELECTRONICALLY SIGNED 11/24/2011 8:44

## 2015-01-25 NOTE — H&P (Signed)
PATIENT NAME:  Amanda Bowen, Amanda Bowen MR#:  [id number] DATE OF BIRTH:  15-May-1922  DATE OF ADMISSION:  12/15/2011  CHIEF COMPLAINT: Weakness, shortness of breath.   HISTORY OF PRESENT ILLNESS: Patient presents to the clinic today after recent labs show a creatinine of 6.6. She was recently discharged from the hospital on 11/23/2011 for acute renal failure and subsequently went to [facility]. She went home two days ago and labs done yesterday showed the elevated creatinine of 6.6. Also her BUN was 78 and her potassium is 5.9. She has been a little more short of breath and has noticed some increased swelling in her legs. She denies any chest pain or heart palpitations. She does have a poor appetite but is trying to drink fluids. She reports urinating normally. Bowels moved regularly yesterday. She does have history of pulmonary fibrosis and is O2 dependent. She denies any fevers or chills. No significant head congestion or headache. Her mental status has been stable.   PAST MEDICAL HISTORY:  1. Renal failure, was hospitalized 02/15 to 11/23/2011.   2. Pulmonary fibrosis, severe with cor pulmonale.  3. Peripheral vascular disease.  4. Gastroesophageal reflux disease.  5. Chronic obstructive pulmonary disease.  6. Diverticulosis.  7. Hypertension.  8. Acute cystitis on hospital admission 11/18/2011.   PAST SURGICAL HISTORY:  1. Partial sigmoid resection for diverticulitis and stricture.  2. Oophorectomy.  3. Appendectomy.  4. Cholecystectomy.  5. Ventral hernia repair.  6. Hemorrhoid banding x2.  7. Left thigh arterial bypass 2002.  8. Cataract surgery both eyes.   ALLERGIES: ACE inhibitors causes cough, Pepcid and gabapentin cause nausea.   CURRENT MEDICATIONS:  1. Omeprazole 20 mg daily. 2. Xanax 0.25 mg daily p.r.n.  3. Albuterol 2 puffs every four hours p.r.n.  4. Symbicort 160/4.5, 2 puffs b.i.d.  5. Milk of magnesia 30 mg daily p.r.n.  6. DuoNebs SVN t.i.d. p.r.n.   7. Calcium 600 mg daily.   SOCIAL HISTORY: No smoking or alcohol. She is widowed. She has five kids. Was recently staying at [facility].   FAMILY HISTORY: Brother died of brain tumor. Other brother died of a cerebrovascular accident. Sister died of a brain tumor. Another sister died of cancer. Mom with stomach cancer. Dad and brother died of myocardial infarction.   REVIEW OF SYSTEMS: As per present illness. Negative in all other systems reviewed.   PHYSICAL EXAMINATION:  GENERAL: Elderly female, no acute distress.   VITAL SIGNS: Weight 112, pulse 90, pressure 142/72, oxygen saturation 95% on 2 liters.   HEENT: Head is normocephalic, atraumatic. Pupils equal, round, reactive to light. Extraocular muscles are intact. Sclera nonicteric. Ear exam shows normal landmarks. Canals are clear. No redness. Oropharynx has dentures upper and lower with clear posterior pharynx.   NECK: Supple. No adenopathy or thyromegaly.   HEART: Regular rate and rhythm with no murmurs noted.   LUNGS: Shows bilateral inspiratory dry crackles in the bases.   ABDOMEN: Her abdominal exam shows hernia in right lower quadrant with mild generalized tenderness, otherwise soft.   EXTREMITIES: Trace edema.   SKIN: Warm and dry.   NEUROLOGIC: Nonfocal.   ASSESSMENT AND PLAN:  1. Acute renal failure. Creatinine yesterday on 03/13 shows 6.6 with BUN 78. Will admit for IV fluids. Consulting nephrology and obtaining renal ultrasound. Begin normal saline at 75 an hour. Will also check labs CBC, MET-C, urinalysis, sedimentation rate, TSH and T4.  2. Pulmonary fibrosis, severe with chronic obstructive pulmonary disease. Continue O2. Also Symbicort, DuoNebs.  3. Back pain, history of thoracic spine possible compression fracture. Will x-ray on admission.  4. Discharge planning will involve social work for skilled nursing facility. Did discuss end of life issues with family and again discussion of dialysis. Further discussion  will be needed.    ____________________________ Marily Lente. Rya Rausch, PA-C rjt:cms D: 12/15/2011 10:44:05 ET T: 12/15/2011 11:22:26 ET JOB#: [id number]  cc: Marily Lente. Lolita Lenz, <Dictator> Leonard Downing PA ELECTRONICALLY SIGNED 12/22/2011 20:40

## 2015-01-25 NOTE — Discharge Summary (Signed)
PATIENT NAME:  Amanda Bowen, Amanda Bowen MR#:  [id number] DATE OF BIRTH:  21-Jun-1922  DATE OF ADMISSION:  12/15/2011 DATE OF DISCHARGE:  12/23/2011  DISCHARGE DIAGNOSES:  1. Acute on chronic renal failure with hyperkalemia.  2. End-stage pulmonary fibrosis with cor pulmonale.  3. Severe peripheral vascular disease.  4. Severe symptomatic anemia.  5. Gastroesophageal reflux disease.  6. Renal mass.  7. Hypertension.   DISCHARGE MEDICATIONS:  1. Xanax 0.25 mg daily p.r.n. anxiety. 2. Ferrous gluconate 240 mg daily.  3. Omeprazole 20 mg daily.  4. Tylenol 650 mg q.4 p.r.n.  5. Hydralazine 10 mg b.i.d.  6. Tramadol 25 mg every eight hours p.r.n.  7. Senna 1 daily.  8. Milk of magnesia 30 mL daily p.r.n. constipation.  9. Nystatin solution 5 mL swish/swallow q.i.d. x7 days. 10. Doxycycline 100 mg b.i.d. x5 days.   REASON FOR ADMISSION: 81 year old female presents with acute renal failure with respiratory failure and progressive anemia. Please see history and physical for history of present illness, past medical history, physical exam.   HOSPITAL COURSE: Patient was admitted, hydrated. Creatinine came down from 6.6 down to 4.2 where she pulled out her IV at that time. Her creatinine drifted back up to 4.5. She cannot tolerate any diuretics whatsoever. There is no indication for dialysis. Her potassium came down. Her hemoglobin dropped down to 6.7. She was mildly heme positive. Will continue on omeprazole and gluconate. Was transfused with 2 units PRBC. Renal mass was found in the left kidney. The family decided not to pursue these issues. Her hemoglobin was 7.8 on discharge. Goal systolic pressure is 150 to 160 to improve her perfusion of her kidneys. Overall prognosis is very poor. Family requesting hospice evaluation. She is DO NOT RESUSCITATE. She does have back pain and rib pain treated with tramadol from time to time although this does give her mild altered mental  status.  ____________________________ Danella PentonMark F. Miller, MD mfm:cms D: 12/23/2011 07:43:01 ET T: 12/23/2011 08:11:26 ET JOB#: [id number]  cc: Danella PentonMark F. Miller, MD, <Dictator>  MARK Sherlene ShamsF MILLER MD ELECTRONICALLY SIGNED 12/26/2011 8:16

## 2015-01-25 NOTE — H&P (Signed)
Past Med/Surgical Hx:  emphysema:   Abdominal hernia:   cellulitis:   exac of copd [id number]:   Pneumonia:   DVT - Deep Vein Thrombosis:   Hemorrhoids:   Ovarian Cyst:   Diverticulitis:   Gastric Reflux:   Hypertension:   Cataract Extraction:   Colon Resection:   Appendectomy:   Gall Bladder:   Hiatal Hernia Repair:   ALLERGIES:  PCN: GI Distress  ASA: Other  Pepcid: Unknown  Codeine: Unknown  Other- Explain in Comments Line: Unknown    Plan 81 yo with PMHx of COPD\Pulm Fibrosis, presenting with SOB, lower back pain, poor PO intake, increase O2 requirement.  1. UTI - UA positive for WBC, LE - levaquin 500mg  IV q 24hrs, transition to po in the am - IVFs - most likely causing some of the lower back pain - most likely causing inc O2 requirement  2. SOB - related to #1  3. ARF - related to #1 - will hold diuretic for now, cont with gentle IVFs - hold ACEI\ARBS, NSAIDS  4. Lower back pain - multifactorial (UTI and L4-S1 disc pathology) - abx, tramadol - supportive care - see official MRI LS and TS report  5. COPD\Chronic Pulm Fibrosis  - currently with inc O2 requirement - I do not believe this is a COPD excerbation, given the clinical history and symptoms - will cont with levaquin, no diffuse wheezing heard, will hold off on steriods - cont with supplement O2 - will give PRN bronchodilators  6. HTN - amlodipine  7. GERD\DVT Prophylaxis - PPI\Heparin  WGN#[id number]#[id number] Time spent evaluating patient = 55 minutes   Electronic Signatures: Stephanie AcreMungal, Harshini Trent (MD)  (Signed 15-Feb-13 18:49)  Authored: PAST MEDICAL/SURGIAL HISTORY, ALLERGIES, ASSESSMENT AND PLAN   Last Updated: 15-Feb-13 18:49 by Stephanie AcreMungal, [place] Scheiderer (MD)
